# Patient Record
Sex: Male | Born: 1985 | Race: White | Hispanic: No | Marital: Married | Smoking: Never smoker
Health system: Southern US, Community
[De-identification: ages and names within clinical notes are randomized; demographics above are authoritative.]

---

## 2018-01-11 ENCOUNTER — Encounter: Payer: Self-pay | Admitting: Internal Medicine

## 2018-01-11 ENCOUNTER — Ambulatory Visit (INDEPENDENT_AMBULATORY_CARE_PROVIDER_SITE_OTHER): Payer: 59 | Admitting: Internal Medicine

## 2018-01-11 VITALS — BP 120/88 | HR 63 | Ht 69.25 in | Wt 174.0 lb

## 2018-01-11 DIAGNOSIS — Z0000 Encounter for general adult medical examination without abnormal findings: Secondary | ICD-10-CM

## 2018-01-11 DIAGNOSIS — Z13220 Encounter for screening for lipoid disorders: Secondary | ICD-10-CM | POA: Diagnosis not present

## 2018-01-11 DIAGNOSIS — H6121 Impacted cerumen, right ear: Secondary | ICD-10-CM

## 2018-01-11 LAB — CBC WITH DIFFERENTIAL/PLATELET
BASOS PCT: 1.5 %
Basophils Absolute: 69 cells/uL (ref 0–200)
EOS ABS: 161 {cells}/uL (ref 15–500)
Eosinophils Relative: 3.5 %
HEMATOCRIT: 45.9 % (ref 38.5–50.0)
Hemoglobin: 16.2 g/dL (ref 13.2–17.1)
LYMPHS ABS: 1868 {cells}/uL (ref 850–3900)
MCH: 29.8 pg (ref 27.0–33.0)
MCHC: 35.3 g/dL (ref 32.0–36.0)
MCV: 84.4 fL (ref 80.0–100.0)
MPV: 9.1 fL (ref 7.5–12.5)
Monocytes Relative: 8.9 %
Neutro Abs: 2093 cells/uL (ref 1500–7800)
Neutrophils Relative %: 45.5 %
PLATELETS: 279 10*3/uL (ref 140–400)
RBC: 5.44 10*6/uL (ref 4.20–5.80)
RDW: 11.8 % (ref 11.0–15.0)
TOTAL LYMPHOCYTE: 40.6 %
WBC mixed population: 409 cells/uL (ref 200–950)
WBC: 4.6 10*3/uL (ref 3.8–10.8)

## 2018-01-11 LAB — COMPLETE METABOLIC PANEL WITH GFR
AG Ratio: 1.8 (calc) (ref 1.0–2.5)
ALBUMIN MSPROF: 4.8 g/dL (ref 3.6–5.1)
ALT: 25 U/L (ref 9–46)
AST: 16 U/L (ref 10–40)
Alkaline phosphatase (APISO): 44 U/L (ref 40–115)
BUN: 18 mg/dL (ref 7–25)
CO2: 30 mmol/L (ref 20–32)
Calcium: 9.7 mg/dL (ref 8.6–10.3)
Chloride: 100 mmol/L (ref 98–110)
Creat: 1.28 mg/dL (ref 0.60–1.35)
GFR, EST NON AFRICAN AMERICAN: 73 mL/min/{1.73_m2} (ref >=60)
GFR, Est African American: 85 mL/min/{1.73_m2} (ref >=60)
GLOBULIN: 2.6 g/dL (ref 1.9–3.7)
Glucose, Bld: 89 mg/dL (ref 65–99)
Potassium: 4.6 mmol/L (ref 3.5–5.3)
SODIUM: 139 mmol/L (ref 135–146)
Total Bilirubin: 0.6 mg/dL (ref 0.2–1.2)
Total Protein: 7.4 g/dL (ref 6.1–8.1)

## 2018-01-11 LAB — POCT URINALYSIS DIPSTICK
APPEARANCE: NORMAL
Bilirubin, UA: NEGATIVE
Blood, UA: NEGATIVE
GLUCOSE UA: NEGATIVE
Ketones, UA: NEGATIVE
LEUKOCYTES UA: NEGATIVE
NITRITE UA: NEGATIVE
ODOR: NORMAL
PROTEIN UA: NEGATIVE
SPEC GRAV UA: 1.02 (ref 1.010–1.025)
Urobilinogen, UA: 0.2 E.U./dL
pH, UA: 6 (ref 5.0–8.0)

## 2018-01-11 LAB — LIPID PANEL
CHOLESTEROL: 148 mg/dL (ref <200)
HDL: 52 mg/dL (ref >40)
LDL Cholesterol (Calc): 83 mg/dL (calc)
Non-HDL Cholesterol (Calc): 96 mg/dL (calc) (ref <130)
Total CHOL/HDL Ratio: 2.8 (calc) (ref <5.0)
Triglycerides: 58 mg/dL (ref <150)

## 2018-01-11 NOTE — Progress Notes (Signed)
   Subjective:    Patient ID: Edward Chambers, male    DOB: 06/18/1986, 32 y.o.   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 Here for wellness exam.  No history of serious illnesses or accidents.  He had a hypospadias repair at age 51.  No fractures.  No known drug allergies.  No chronic medications.  Had tetanus immunization in [place] January 30, 2009  No chronic medications.  Social history: He is single.  Completed vascular tech school in [place].  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  He alternates between visits and hospital in [facility].  Does not smoke.  Social alcohol consumption.  Family history: Father age 40 with history of gout.  Mother age 16 1 with history of kidney stones.  One brother age 57 in good health.    Review of Systems  Constitutional: Negative.   All other systems reviewed and are negative.      Objective:   Physical Exam  Constitutional: He is oriented to person, place, and time. He appears well-developed and well-nourished. No distress.  HENT:  Head: Normocephalic and atraumatic.  Right Ear: External ear normal.  Left Ear: External ear normal.  Mouth/Throat: Oropharynx is clear and moist.  Ear wax removed from both ears with curette  Eyes: Pupils are equal, round, and reactive to light. Conjunctivae are normal. Left eye exhibits no discharge. No scleral icterus.  Neck: Neck supple. No JVD present. No thyromegaly present.  Cardiovascular: Normal rate, regular rhythm and normal heart sounds. Exam reveals no gallop and no friction rub.  No murmur heard. Pulmonary/Chest: Effort normal. No respiratory distress. He has no wheezes. He has no rales.  Abdominal: Soft. He exhibits no distension and no mass. There is no tenderness. There is no rebound and no guarding.  Genitourinary:  Genitourinary Comments: No  hernias  Musculoskeletal: He exhibits no edema or deformity.  Lymphadenopathy:    He has no cervical adenopathy.  Neurological: He is alert and oriented to person, place, and time. He displays normal reflexes. No cranial nerve deficit or sensory deficit. He exhibits normal muscle tone. Coordination normal.  Skin: Skin is warm and dry. He is not diaphoretic.          Assessment & Plan:   Cerumen both ears-removed with curette  Routine health maintenance  Plan: Fasting labs drawn and pending.  Return in 1 to 2 years or as needed.  Tetanus immunization is up-to-date.

## 2018-01-11 NOTE — Progress Notes (Deleted)
   Subjective:    Patient ID: Edward Chambers, male    DOB: 12-23-1985, 32 y.o.   MRN: [id number]  HPI    Review of Systems     Objective:   Physical Exam        Assessment & Plan:

## 2018-01-11 NOTE — Patient Instructions (Signed)
It was a pleasure to see you today. Labs drawn and pending.

## 2019-12-20 DIAGNOSIS — H5213 Myopia, bilateral: Secondary | ICD-10-CM | POA: Diagnosis not present

## 2020-03-21 ENCOUNTER — Encounter: Payer: Self-pay | Admitting: Internal Medicine

## 2020-03-21 ENCOUNTER — Telehealth: Payer: Self-pay | Admitting: Internal Medicine

## 2020-03-21 ENCOUNTER — Other Ambulatory Visit: Payer: Self-pay

## 2020-03-21 ENCOUNTER — Ambulatory Visit: Payer: 59 | Admitting: Internal Medicine

## 2020-03-21 DIAGNOSIS — M545 Low back pain, unspecified: Secondary | ICD-10-CM

## 2020-03-21 DIAGNOSIS — S50812A Abrasion of left forearm, initial encounter: Secondary | ICD-10-CM

## 2020-03-21 DIAGNOSIS — M542 Cervicalgia: Secondary | ICD-10-CM
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
## 2020-03-21 NOTE — Telephone Encounter (Signed)
We can see him today

## 2020-03-21 NOTE — Patient Instructions (Addendum)
Please have x-ray of C-spine today at [facility].  Take ibuprofen 800 mg up to 3 times a day if needed for musculoskeletal pain.  Apply antibiotic ointment to the abrasion left forearm and keep it wrapped until healed. Apply ice to the neck area for neck musculoskeletal spasms 20 minutes 2 or 3 times daily.  May apply heat to the lower back area for 20 minutes 2 or 3 times daily.  Call if not improving in 7 to 10 days or sooner if worse or if developing new symptoms.  It was a pleasure to see you today.

## 2020-03-21 NOTE — Telephone Encounter (Signed)
scheduled
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
## 2020-03-22 ENCOUNTER — Ambulatory Visit (HOSPITAL_COMMUNITY)
Admission: RE | Admit: 2020-03-22 | Discharge: 2020-03-22 | Disposition: A | Discharge status: Home / Self Care | Payer: 59 | Source: Ambulatory Visit | Attending: Internal Medicine | Admitting: Internal Medicine

## 2020-03-22 DIAGNOSIS — M542 Cervicalgia: Secondary | ICD-10-CM | POA: Insufficient documentation

## 2020-03-22 DIAGNOSIS — M2578 Osteophyte, vertebrae: Secondary | ICD-10-CM | POA: Diagnosis not present

## 2020-07-08 DIAGNOSIS — Z20822 Contact with and (suspected) exposure to covid-19: Secondary | ICD-10-CM | POA: Diagnosis not present

## 2020-10-30 DIAGNOSIS — Z0289 Encounter for other administrative examinations: Secondary | ICD-10-CM
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
## 2021-03-23 ENCOUNTER — Telehealth (INDEPENDENT_AMBULATORY_CARE_PROVIDER_SITE_OTHER): Payer: 59 | Admitting: Internal Medicine

## 2021-03-23 ENCOUNTER — Telehealth: Payer: Self-pay | Admitting: Internal Medicine

## 2021-03-23 ENCOUNTER — Other Ambulatory Visit: Payer: Self-pay

## 2021-03-23 ENCOUNTER — Encounter: Payer: Self-pay | Admitting: Internal Medicine

## 2021-03-23 VITALS — Temp 97.5°F

## 2021-03-23 DIAGNOSIS — U071 COVID-19: Secondary | ICD-10-CM

## 2021-03-23 MED ORDER — AZITHROMYCIN 250 MG PO TABS: ORAL_TABLET | ORAL | 0 refills | Status: AC

## 2021-03-23 NOTE — Telephone Encounter (Signed)
Scheduled video visit 

## 2021-03-23 NOTE — Progress Notes (Signed)
   Subjective:    Patient ID: Edward Chambers, male    DOB: 25-Oct-1985, 35 y.o.   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  On Saturday, July 30 he had respiratory congestion.  Subsequently developed fever of 101.2 degrees treated with over-the-counter ibuprofen.  Has some discolored sputum production and cough.  Has malaise, myalgias and fatigue.  No shortness of breath reported.  May want to purchase pulse oximeter to have on hand.  Patient is seen virtually due to the Coronavirus pandemic.  He lives out of town in [place] and this is a convenient venue for him.  Patient was seen via interactive audio and video telecommunications today.  He is agreeable to visit in this format.  He is identified using 2 identifiers as Edward Chambers. [place], a patient in this practice.  He is at his home and I am in my office.  Patient reports that he tested positive for COVID-19 through a home test yesterday.  He is not sure exactly how he was exposed or when he was exposed but has been working with COVID patients at [facility].  His general health is excellent.  He has no history of serious illnesses or accidents.  No known drug allergies.  No chronic medications.  Social history: He is single.  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  Does not smoke.  Social alcohol consumption.  Review of Systems see above     Objective:   Physical Exam Patient reports temp 97.5 degrees which is under normal.  He appears to be slightly pale but in no acute respiratory distress.  He gives clear concise history of his symptoms       Assessment & Plan:  Acute COVID-19 viral infection.  Had positive home test.  Health at work may require PCR testing.  Plan: Zithromax Z-PAK 2 tabs day 1 followed by 1 tab days 2 through 5.  He has no history of multiple medical conditions and I think he  should recover well without Paxlovid.  He is to rest and drink plenty of fluids and monitor his pulse oximetry.  Also would like for him to walk around his house and yard several times daily.  Call if symptoms worsen or do not improve.  We were happy to provide work excuse for him for 5-days.

## 2021-03-23 NOTE — Patient Instructions (Signed)
Take Zithromax Z pak as directed. Stay well hydrated. Monitor pulse ox if Short of breath. May take Tylenol alternating with Motrin for fever and myalgias. Call if symptoms worsen or not improving.
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
## 2021-03-31 ENCOUNTER — Other Ambulatory Visit (HOSPITAL_COMMUNITY): Payer: Self-pay

## 2021-03-31 MED ORDER — CARESTART COVID-19 HOME TEST VI KIT
PACK | 0 refills | Status: AC
  Filled 2021-03-31: qty 4, 4d supply, fill #0

## 2022-06-21 DIAGNOSIS — Z3141 Encounter for fertility testing: Secondary | ICD-10-CM | POA: Diagnosis not present
